# Patient Record
Sex: Male | Born: 1980
Health system: Southern US, Community
[De-identification: ages and names within clinical notes are randomized; demographics above are authoritative.]

## PROBLEM LIST (undated history)

## (undated) HISTORY — PX: WISDOM TOOTH EXTRACTION: SHX21

## (undated) HISTORY — PX: VASECTOMY: SHX75

## (undated) HISTORY — PX: HERNIA REPAIR: SHX51

---

## 2015-05-19 ENCOUNTER — Emergency Department (HOSPITAL_BASED_OUTPATIENT_CLINIC_OR_DEPARTMENT_OTHER): Payer: Worker's Compensation

## 2015-05-19 ENCOUNTER — Encounter (HOSPITAL_BASED_OUTPATIENT_CLINIC_OR_DEPARTMENT_OTHER): Payer: Self-pay

## 2015-05-19 ENCOUNTER — Emergency Department (HOSPITAL_BASED_OUTPATIENT_CLINIC_OR_DEPARTMENT_OTHER)
Admission: EM | Admit: 2015-05-19 | Discharge: 2015-05-19 | Disposition: A | Discharge status: Home / Self Care | Payer: Worker's Compensation | Attending: Emergency Medicine | Admitting: Emergency Medicine

## 2015-05-19 DIAGNOSIS — S6991XA Unspecified injury of right wrist, hand and finger(s), initial encounter: Secondary | ICD-10-CM | POA: Diagnosis present

## 2015-05-19 DIAGNOSIS — Y9389 Activity, other specified: Secondary | ICD-10-CM | POA: Insufficient documentation

## 2015-05-19 DIAGNOSIS — M79641 Pain in right hand: Secondary | ICD-10-CM

## 2015-05-19 DIAGNOSIS — Y9289 Other specified places as the place of occurrence of the external cause: Secondary | ICD-10-CM | POA: Insufficient documentation

## 2015-05-19 DIAGNOSIS — Y998 Other external cause status: Secondary | ICD-10-CM | POA: Diagnosis not present

## 2015-05-19 DIAGNOSIS — S6992XA Unspecified injury of left wrist, hand and finger(s), initial encounter: Secondary | ICD-10-CM | POA: Diagnosis not present

## 2015-05-19 MED ORDER — IBUPROFEN 800 MG PO TABS
800.0000 mg | ORAL_TABLET | Freq: Once | ORAL | Status: AC
  Administered 2015-05-19: 800 mg via ORAL
  Filled 2015-05-19: qty 1

## 2015-05-19 MED ORDER — IBUPROFEN 800 MG PO TABS: 800.0000 mg | ORAL_TABLET | Freq: Three times a day (TID) | ORAL | Status: AC

## 2015-05-19 MED ORDER — ACETAMINOPHEN 325 MG PO TABS
650.0000 mg | ORAL_TABLET | Freq: Once | ORAL | Status: AC
  Administered 2015-05-19: 650 mg via ORAL
  Filled 2015-05-19: qty 2

## 2015-05-19 MED FILL — IBUPROFEN 800 MG TABLET: 800 | 7 days supply | Qty: 21 | Fill #0
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
## 2015-05-19 NOTE — ED Notes (Signed)
Patient transported to X-ray 
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
## 2015-05-19 NOTE — Discharge Instructions (Signed)
Hand Contusion  A hand contusion is a deep bruise on your hand area. Contusions are the result of an injury that caused bleeding under the skin. The contusion may turn blue, purple, or yellow. Minor injuries will give you a painless contusion, but more severe contusions may stay painful and swollen for a few weeks.  CAUSES   A contusion is usually caused by a blow, trauma, or direct force to an area of the body.  SYMPTOMS    Swelling and redness of the injured area.   Discoloration of the injured area.   Tenderness and soreness of the injured area.   Pain.  DIAGNOSIS   The diagnosis can be made by taking a history and performing a physical exam. An X-ray, CT scan, or MRI may be needed to determine if there were any associated injuries, such as broken bones (fractures).  TREATMENT   Often, the best treatment for a hand contusion is resting, elevating, icing, and applying cold compresses to the injured area. Over-the-counter medicines may also be recommended for pain control.  HOME CARE INSTRUCTIONS    Put ice on the injured area.    Put ice in a plastic bag.    Place a towel between your skin and the bag.    Leave the ice on for 15-20 minutes, 03-04 times a day.   Only take over-the-counter or prescription medicines as directed by your caregiver. Your caregiver may recommend avoiding anti-inflammatory medicines (aspirin, ibuprofen, and naproxen) for 48 hours because these medicines may increase bruising.   If told, use an elastic wrap as directed. This can help reduce swelling. You may remove the wrap for sleeping, showering, and bathing. If your fingers become numb, cold, or blue, take the wrap off and reapply it more loosely.   Elevate your hand with pillows to reduce swelling.   Avoid overusing your hand if it is painful.  SEEK IMMEDIATE MEDICAL CARE IF:    You have increased redness, swelling, or pain in your hand.   Your swelling or pain is not relieved with medicines.   You have loss of feeling in  your hand or are unable to move your fingers.   Your hand turns cold or blue.   You have pain when you move your fingers.   Your hand becomes warm to the touch.   Your contusion does not improve in 2 days.  MAKE SURE YOU:    Understand these instructions.   Will watch your condition.   Will get help right away if you are not doing well or get worse.     This information is not intended to replace advice given to you by your health care provider. Make sure you discuss any questions you have with your health care provider.     Document Released: 10/16/2001 Document Revised: 01/19/2012 Document Reviewed: 10/18/2011  [employer] Interactive Patient Education 2016 [employer]
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
